# Patient Record
Sex: Male | Born: 1985 | Race: White | Hispanic: No | Marital: Single | Smoking: Current every day smoker
Health system: Southern US, Community
[De-identification: ages and names within clinical notes are randomized; demographics above are authoritative.]

## PROBLEM LIST (undated history)

## (undated) DIAGNOSIS — I10 Essential (primary) hypertension: Secondary | ICD-10-CM

## (undated) DIAGNOSIS — R000 Tachycardia, unspecified: Secondary | ICD-10-CM

## (undated) HISTORY — PX: HERNIA REPAIR: SHX51

---

## 2015-01-08 ENCOUNTER — Emergency Department (HOSPITAL_COMMUNITY)
Admission: EM | Admit: 2015-01-08 | Discharge: 2015-01-08 | Disposition: A | Discharge status: Home / Self Care | Payer: Self-pay | Attending: Emergency Medicine | Admitting: Emergency Medicine

## 2015-01-08 ENCOUNTER — Encounter (HOSPITAL_COMMUNITY): Payer: Self-pay | Admitting: Emergency Medicine

## 2015-01-08 DIAGNOSIS — I10 Essential (primary) hypertension: Secondary | ICD-10-CM | POA: Insufficient documentation

## 2015-01-08 DIAGNOSIS — M5432 Sciatica, left side: Secondary | ICD-10-CM | POA: Insufficient documentation

## 2015-01-08 DIAGNOSIS — Z720 Tobacco use: Secondary | ICD-10-CM | POA: Insufficient documentation

## 2015-01-08 DIAGNOSIS — Z79899 Other long term (current) drug therapy: Secondary | ICD-10-CM | POA: Insufficient documentation

## 2015-01-08 HISTORY — DX: Essential (primary) hypertension: I10

## 2015-01-08 HISTORY — DX: Tachycardia, unspecified: R00.0

## 2015-01-08 MED ORDER — OXYCODONE-ACETAMINOPHEN 5-325 MG PO TABS: 1.0000 | ORAL_TABLET | ORAL | Status: DC | PRN

## 2015-01-08 NOTE — ED Notes (Signed)
nad noted prior to dc. Dc instructions reviewed with pt. Voiced understanding. 1 Rx given. Ambulated out without difficulty.
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
## 2015-01-08 NOTE — Discharge Instructions (Signed)
Sciatica °Sciatica is pain, weakness, numbness, or tingling along your sciatic nerve. The nerve starts in the lower back and runs down the back of each leg. Nerve damage or certain conditions pinch or put pressure on the sciatic nerve. This causes the pain, weakness, and other discomforts of sciatica. °HOME CARE  °· Only take medicine as told by your doctor. °· Apply ice to the affected area for 20 minutes. Do this 3-4 times a day for the first 48-72 hours. Then try heat in the same way. °· Exercise, stretch, or do your usual activities if these do not make your pain worse. °· Go to physical therapy as told by your doctor. °· Keep all doctor visits as told. °· Do not wear high heels or shoes that are not supportive. °· Get a firm mattress if your mattress is too soft to lessen pain and discomfort. °GET HELP RIGHT AWAY IF:  °· You cannot control when you poop (bowel movement) or pee (urinate). °· You have more weakness in your lower back, lower belly (pelvis), butt (buttocks), or legs. °· You have redness or puffiness (swelling) of your back. °· You have a burning feeling when you pee. °· You have pain that gets worse when you lie down. °· You have pain that wakes you from your sleep. °· Your pain is worse than past pain. °· Your pain lasts longer than 4 weeks. °· You are suddenly losing weight without reason. °MAKE SURE YOU:  °· Understand these instructions. °· Will watch this condition. °· Will get help right away if you are not doing well or get worse. °Document Released: 07/21/2008 Document Revised: 04/12/2012 Document Reviewed: 02/21/2012 °Exit[employer]® Patient Information ©2015 [employer]. This information is not intended to replace advice given to you by your health care provider. Make sure you discuss any questions you have with your health care provider. ° °

## 2015-01-22 ENCOUNTER — Encounter (HOSPITAL_COMMUNITY): Payer: Self-pay | Admitting: Emergency Medicine

## 2015-01-22 ENCOUNTER — Emergency Department (HOSPITAL_COMMUNITY): Payer: Self-pay

## 2015-01-22 ENCOUNTER — Emergency Department (HOSPITAL_COMMUNITY)
Admission: EM | Admit: 2015-01-22 | Discharge: 2015-01-22 | Disposition: A | Discharge status: Home / Self Care | Payer: Self-pay | Attending: Emergency Medicine | Admitting: Emergency Medicine

## 2015-01-22 DIAGNOSIS — Y990 Civilian activity done for income or pay: Secondary | ICD-10-CM | POA: Insufficient documentation

## 2015-01-22 DIAGNOSIS — Z79899 Other long term (current) drug therapy: Secondary | ICD-10-CM | POA: Insufficient documentation

## 2015-01-22 DIAGNOSIS — M5432 Sciatica, left side: Secondary | ICD-10-CM | POA: Insufficient documentation

## 2015-01-22 DIAGNOSIS — S3992XA Unspecified injury of lower back, initial encounter: Secondary | ICD-10-CM | POA: Insufficient documentation

## 2015-01-22 DIAGNOSIS — I10 Essential (primary) hypertension: Secondary | ICD-10-CM | POA: Insufficient documentation

## 2015-01-22 DIAGNOSIS — Z720 Tobacco use: Secondary | ICD-10-CM | POA: Insufficient documentation

## 2015-01-22 DIAGNOSIS — Y9389 Activity, other specified: Secondary | ICD-10-CM | POA: Insufficient documentation

## 2015-01-22 DIAGNOSIS — W108XXA Fall (on) (from) other stairs and steps, initial encounter: Secondary | ICD-10-CM | POA: Insufficient documentation

## 2015-01-22 DIAGNOSIS — G8929 Other chronic pain: Secondary | ICD-10-CM | POA: Insufficient documentation

## 2015-01-22 DIAGNOSIS — Y9289 Other specified places as the place of occurrence of the external cause: Secondary | ICD-10-CM | POA: Insufficient documentation

## 2015-01-22 DIAGNOSIS — S8992XA Unspecified injury of left lower leg, initial encounter: Secondary | ICD-10-CM | POA: Insufficient documentation

## 2015-01-22 MED ORDER — OXYCODONE-ACETAMINOPHEN 5-325 MG PO TABS: 1.0000 | ORAL_TABLET | ORAL | Status: AC | PRN

## 2015-01-22 NOTE — ED Notes (Signed)
Pt presents to ED following a fall at work.  He was carrying a box down stairs and fell down about three steps, injuring his back.  Pt reports pain in the center of his lower back and shooting down his left leg.  The pain in his back is described as sharp and stabbing but is more like a burning sensation in his left leg.  Pt currently rates pain as 7/10 and has taken 800mg  Ibuprofen which only minimally improved his pain level.
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
## 2015-01-22 NOTE — Discharge Instructions (Signed)
Sciatica °Sciatica is pain, weakness, numbness, or tingling along the path of the sciatic nerve. The nerve starts in the lower back and runs down the back of each leg. The nerve controls the muscles in the lower leg and in the back of the knee, while also providing sensation to the back of the thigh, lower leg, and the sole of your foot. Sciatica is a symptom of another medical condition. For instance, nerve damage or certain conditions, such as a herniated disk or bone spur on the spine, pinch or put pressure on the sciatic nerve. This causes the pain, weakness, or other sensations normally associated with sciatica. Generally, sciatica only affects one side of the body. °CAUSES  °· Herniated or slipped disc. °· Degenerative disk disease. °· A pain disorder involving the narrow muscle in the buttocks (piriformis syndrome). °· Pelvic injury or fracture. °· Pregnancy. °· Tumor (rare). °SYMPTOMS  °Symptoms can vary from mild to very severe. The symptoms usually travel from the low back to the buttocks and down the back of the leg. Symptoms can include: °· Mild tingling or dull aches in the lower back, leg, or hip. °· Numbness in the back of the calf or sole of the foot. °· Burning sensations in the lower back, leg, or hip. °· Sharp pains in the lower back, leg, or hip. °· Leg weakness. °· Severe back pain inhibiting movement. °These symptoms may get worse with coughing, sneezing, laughing, or prolonged sitting or standing. Also, being overweight may worsen symptoms. °DIAGNOSIS  °Your caregiver will perform a physical exam to look for common symptoms of sciatica. He or she may ask you to do certain movements or activities that would trigger sciatic nerve pain. Other tests may be performed to find the cause of the sciatica. These may include: °· Blood tests. °· X-rays. °· Imaging tests, such as an MRI or CT scan. °TREATMENT  °Treatment is directed at the cause of the sciatic pain. Sometimes, treatment is not necessary  and the pain and discomfort goes away on its own. If treatment is needed, your caregiver may suggest: °· Over-the-counter medicines to relieve pain. °· Prescription medicines, such as anti-inflammatory medicine, muscle relaxants, or narcotics. °· Applying heat or ice to the painful area. °· Steroid injections to lessen pain, irritation, and inflammation around the nerve. °· Reducing activity during periods of pain. °· Exercising and stretching to strengthen your abdomen and improve flexibility of your spine. Your caregiver may suggest losing weight if the extra weight makes the back pain worse. °· Physical therapy. °· Surgery to eliminate what is pressing or pinching the nerve, such as a bone spur or part of a herniated disk. °HOME CARE INSTRUCTIONS  °· Only take over-the-counter or prescription medicines for pain or discomfort as directed by your caregiver. °· Apply ice to the affected area for 20 minutes, 3-4 times a day for the first 48-72 hours. Then try heat in the same way. °· Exercise, stretch, or perform your usual activities if these do not aggravate your pain. °· Attend physical therapy sessions as directed by your caregiver. °· Keep all follow-up appointments as directed by your caregiver. °· Do not wear high heels or shoes that do not provide proper support. °· Check your mattress to see if it is too soft. A firm mattress may lessen your pain and discomfort. °SEEK IMMEDIATE MEDICAL CARE IF:  °· You lose control of your bowel or bladder (incontinence). °· You have increasing weakness in the lower back, pelvis, buttocks,   or legs.  You have redness or swelling of your back.  You have a burning sensation when you urinate.  You have pain that gets worse when you lie down or awakens you at night.  Your pain is worse than you have experienced in the past.  Your pain is lasting longer than 4 weeks.  You are suddenly losing weight without reason. MAKE SURE YOU:  Understand these  instructions.  Will watch your condition.  Will get help right away if you are not doing well or get worse. Document Released: 10/06/2001 Document Revised: 04/12/2012 Document Reviewed: 02/21/2012 [facility] Patient Information 2015 [place], [place]. This information is not intended to replace advice given to you by your health care provider. Make sure you discuss any questions you have with your health care provider.  Hypertension Hypertension, commonly called high blood pressure, is when the force of blood pumping through your arteries is too strong. Your arteries are the blood vessels that carry blood from your heart throughout your body. A blood pressure reading consists of a higher number over a lower number, such as 110/72. The higher number (systolic) is the pressure inside your arteries when your heart pumps. The lower number (diastolic) is the pressure inside your arteries when your heart relaxes. Ideally you want your blood pressure below 120/80. Hypertension forces your heart to work harder to pump blood. Your arteries may become narrow or stiff. Having hypertension puts you at risk for heart disease, stroke, and other problems.  RISK FACTORS Some risk factors for high blood pressure are controllable. Others are not.  Risk factors you cannot control include:   Race. You may be at higher risk if you are African American.  Age. Risk increases with age.  Gender. Men are at higher risk than women before age 70 years. After age 63, women are at higher risk than men. Risk factors you can control include:  Not getting enough exercise or physical activity.  Being overweight.  Getting too much fat, sugar, calories, or salt in your diet.  Drinking too much alcohol. SIGNS AND SYMPTOMS Hypertension does not usually cause signs or symptoms. Extremely high blood pressure (hypertensive crisis) may cause headache, anxiety, shortness of breath, and nosebleed. DIAGNOSIS  To check if you have  hypertension, your health care provider will measure your blood pressure while you are seated, with your arm held at the level of your heart. It should be measured at least twice using the same arm. Certain conditions can cause a difference in blood pressure between your right and left arms. A blood pressure reading that is higher than normal on one occasion does not mean that you need treatment. If one blood pressure reading is high, ask your health care provider about having it checked again. TREATMENT  Treating high blood pressure includes making lifestyle changes and possibly taking medicine. Living a healthy lifestyle can help lower high blood pressure. You may need to change some of your habits. Lifestyle changes may include:  Following the DASH diet. This diet is high in fruits, vegetables, and whole grains. It is low in salt, red meat, and added sugars.  Getting at least 2 hours of brisk physical activity every week.  Losing weight if necessary.  Not smoking.  Limiting alcoholic beverages.  Learning ways to reduce stress. If lifestyle changes are not enough to get your blood pressure under control, your health care provider may prescribe medicine. You may need to take more than one. Work closely with your health care  provider to understand the risks and benefits. HOME CARE INSTRUCTIONS  Have your blood pressure rechecked as directed by your health care provider.   Take medicines only as directed by your health care provider. Follow the directions carefully. Blood pressure medicines must be taken as prescribed. The medicine does not work as well when you skip doses. Skipping doses also puts you at risk for problems.   Do not smoke.   Monitor your blood pressure at home as directed by your health care provider. SEEK MEDICAL CARE IF:   You think you are having a reaction to medicines taken.  You have recurrent headaches or feel dizzy.  You have swelling in your  ankles.  You have trouble with your vision. SEEK IMMEDIATE MEDICAL CARE IF:  You develop a severe headache or confusion.  You have unusual weakness, numbness, or feel faint.  You have severe chest or abdominal pain.  You vomit repeatedly.  You have trouble breathing. MAKE SURE YOU:   Understand these instructions.  Will watch your condition.  Will get help right away if you are not doing well or get worse. Document Released: 10/12/2005 Document Revised: 02/26/2014 Document Reviewed: 08/04/2013 [facility] Patient Information 2015 [place]. This information is not intended to replace advice given to you by your health care provider. Make sure you discuss any questions you have with your health care provider.   Continue taking your ibuprofen and flexeril as you are currently.  You may add the percocet - use caution as this medicine will make you sleepy - do not drive within  4 hours of taking this medicine.  Keep your appointment with your back specialist next week as planned.  Make sure you take your blood pressure medicine as soon as you get home - it was elevated today at 158/115.

## 2015-05-19 IMAGING — DX DG LUMBAR SPINE COMPLETE 4+V
5 series · 5 of 5 positions shown · non-contrast
Comparison: None.

CLINICAL DATA: Low back pain and left leg pain. The patient fell at
work down 3 steps.

EXAM:
LUMBAR SPINE - COMPLETE 4+ VIEW

[l-spine ap]
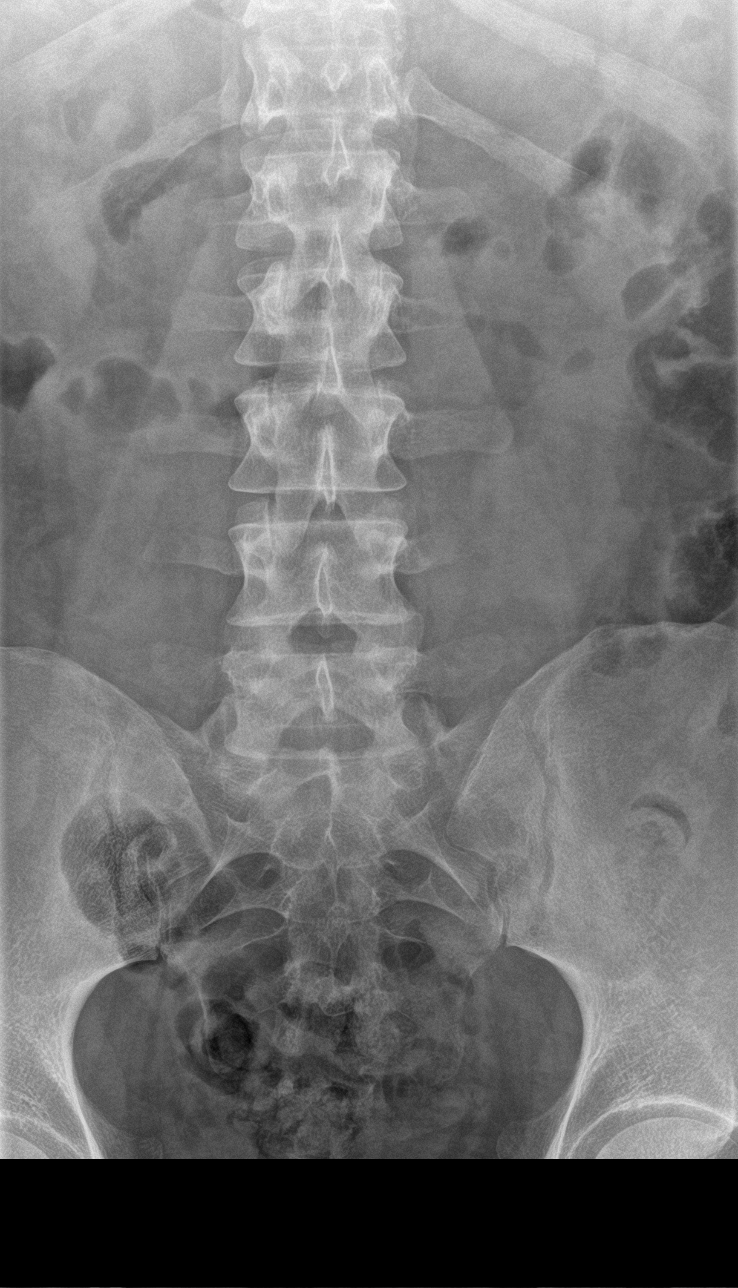

[l-spine obl (1 of 2)]
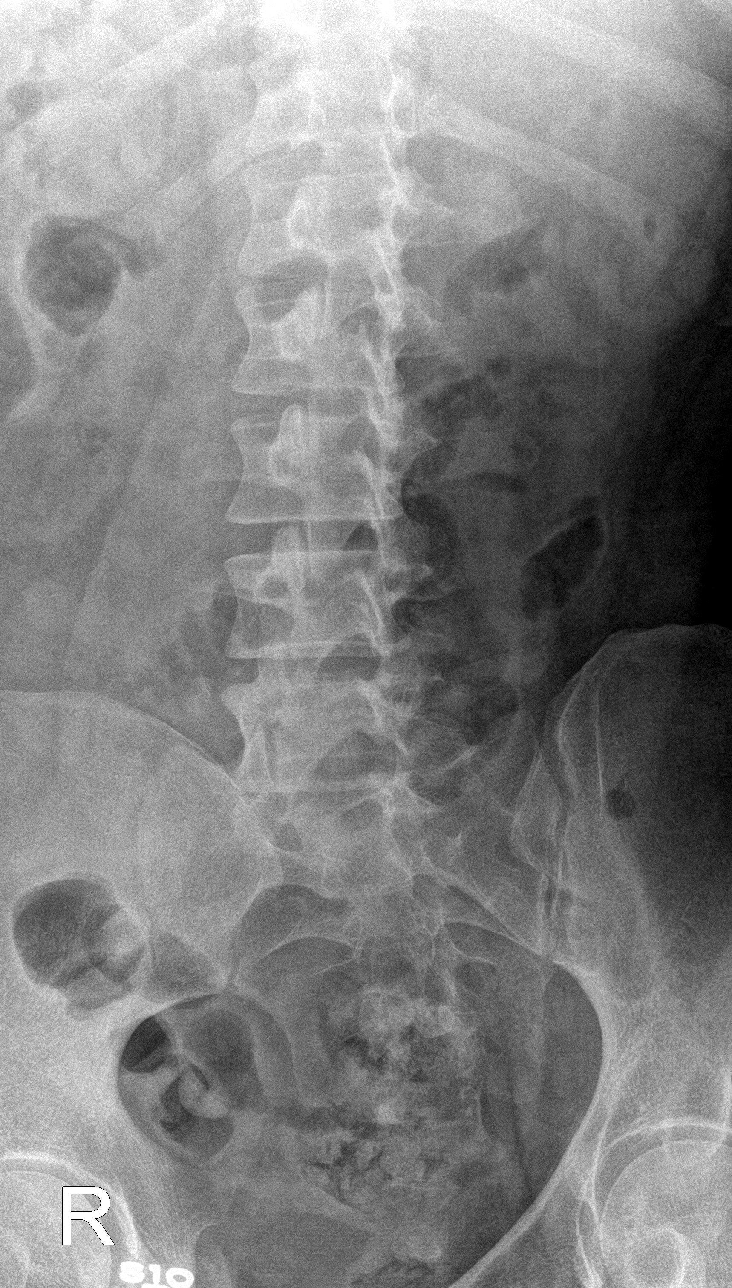

[l-spine obl (2 of 2)]
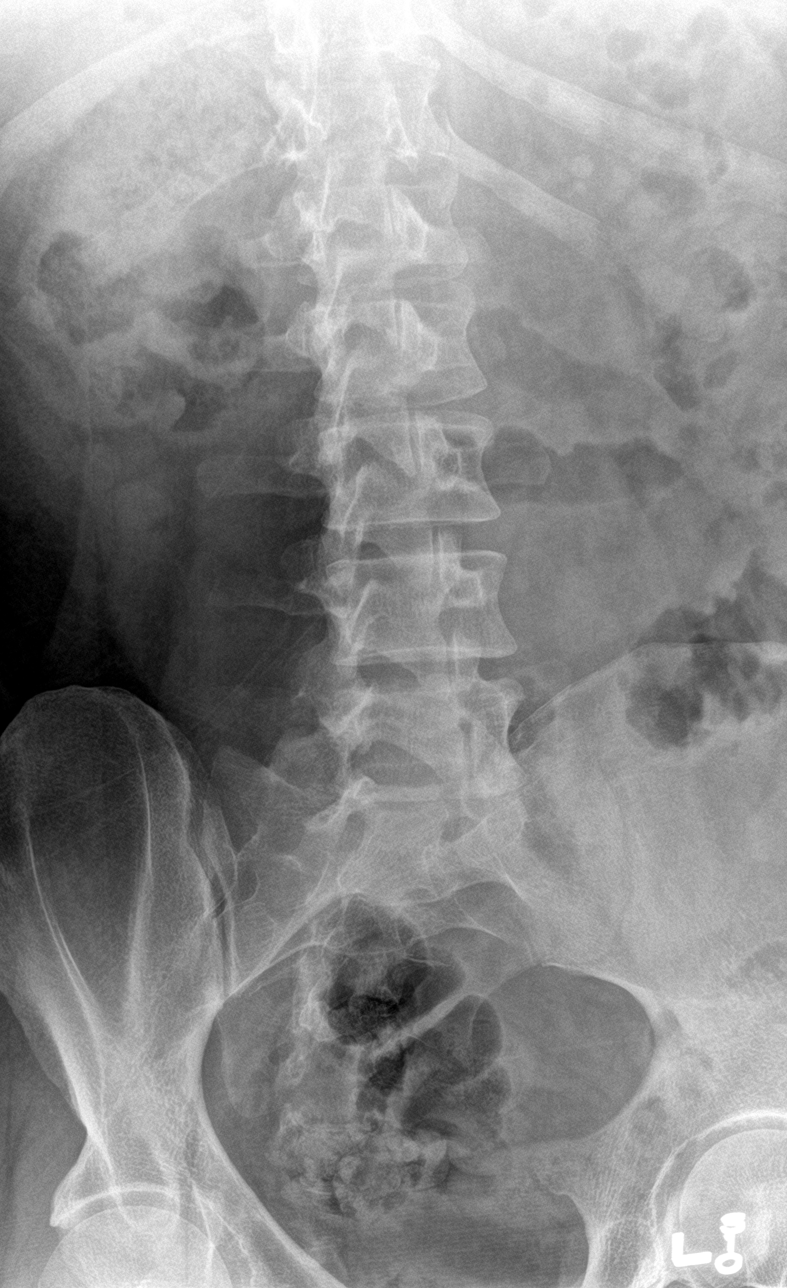

[l-spine lat]
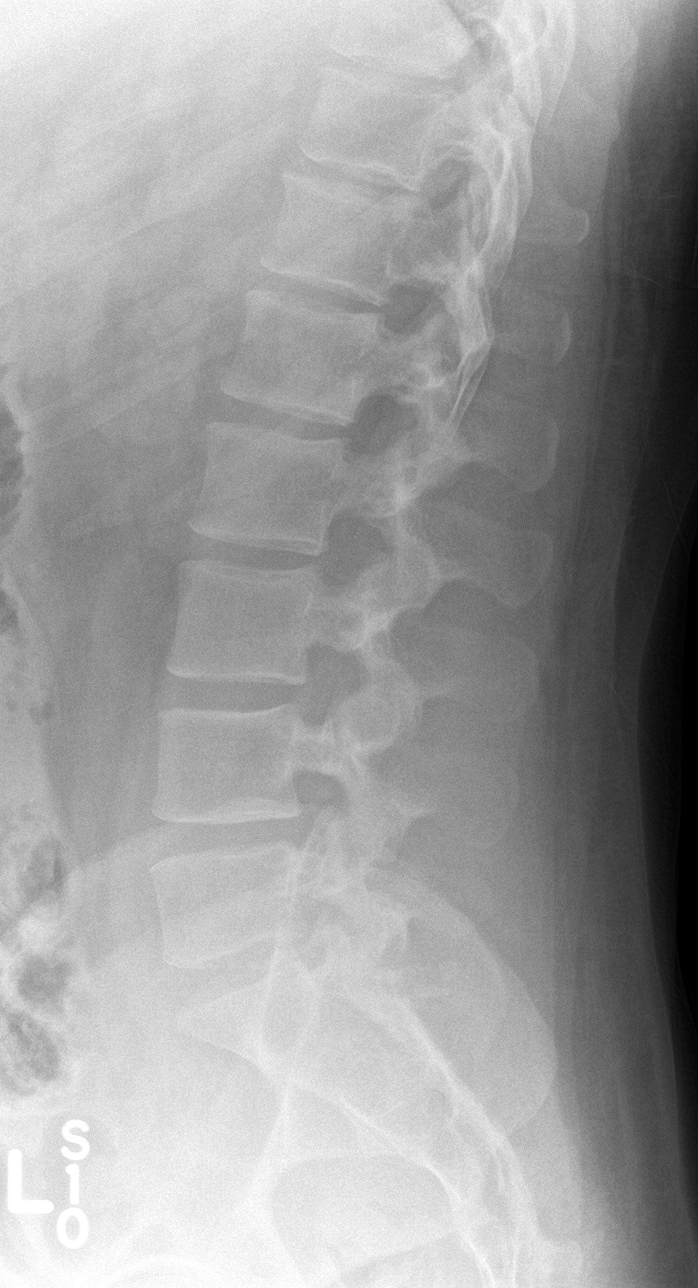

[l-spine spot]
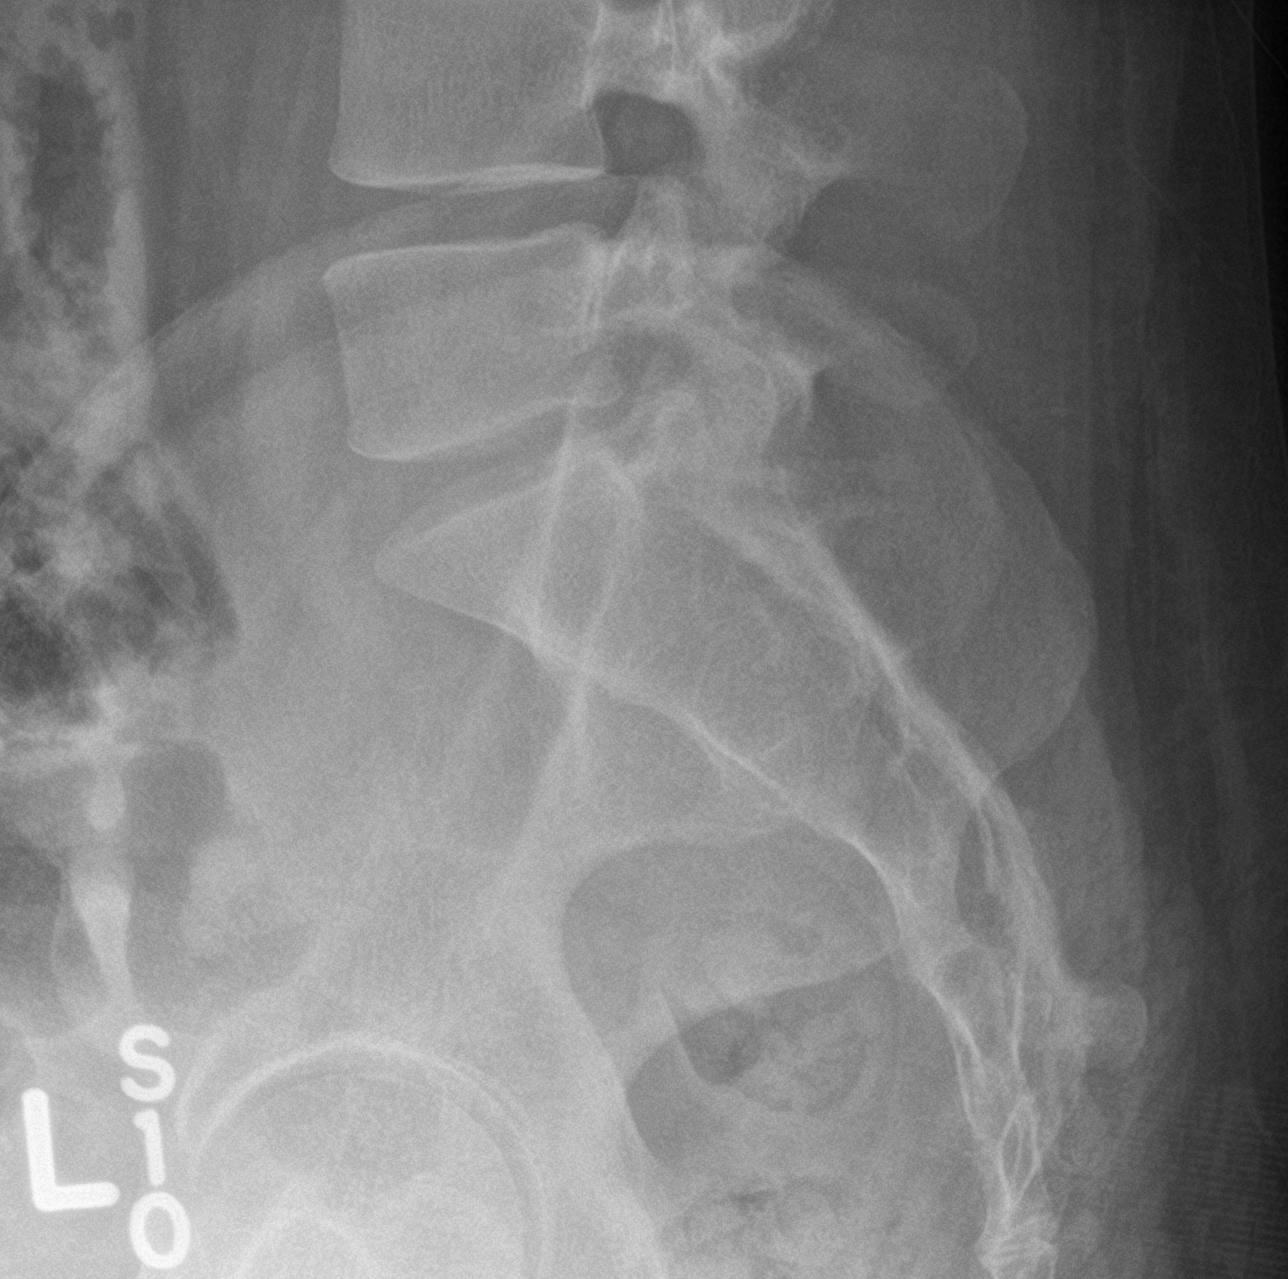

[5 of 5 positions shown; findings below may reference images not displayed]

FINDINGS: There is no evidence of lumbar spine fracture. Alignment is normal.
Intervertebral disc spaces are maintained. No appreciable facet
arthritis.
IMPRESSION: Normal exam.
